# Patient Record
Sex: Female | Born: 1990 | Race: White | Hispanic: No | Marital: Single | Smoking: Never smoker
Health system: Southern US, Community
[De-identification: ages and names within clinical notes are randomized; demographics above are authoritative.]

---

## 2013-04-15 ENCOUNTER — Emergency Department: Payer: Self-pay | Admitting: Emergency Medicine

## 2013-04-15 LAB — URINALYSIS, COMPLETE
Glucose,UR: NEGATIVE mg/dL (ref 0–75)
Leukocyte Esterase: NEGATIVE
Nitrite: NEGATIVE
Protein: NEGATIVE
RBC,UR: 32 /HPF (ref 0–5)
Squamous Epithelial: 1
WBC UR: 7 /HPF (ref 0–5)

## 2013-04-22 ENCOUNTER — Emergency Department: Payer: Self-pay | Admitting: Emergency Medicine

## 2013-08-15 LAB — CBC WITH DIFFERENTIAL/PLATELET
BASOS ABS: 0 10*3/uL (ref 0.0–0.1)
Basophil %: 0.2 %
EOS PCT: 1 %
Eosinophil #: 0.1 10*3/uL (ref 0.0–0.7)
HCT: 38.6 % (ref 35.0–47.0)
HGB: 13.1 g/dL (ref 12.0–16.0)
LYMPHS ABS: 1.6 10*3/uL (ref 1.0–3.6)
Lymphocyte %: 14.1 %
MCH: 29.1 pg (ref 26.0–34.0)
MCHC: 34.1 g/dL (ref 32.0–36.0)
MCV: 86 fL (ref 80–100)
MONO ABS: 1.1 x10 3/mm — AB (ref 0.2–0.9)
MONOS PCT: 10.1 %
NEUTROS PCT: 74.6 %
Neutrophil #: 8.4 10*3/uL — ABNORMAL HIGH (ref 1.4–6.5)
Platelet: 228 10*3/uL (ref 150–440)
RBC: 4.52 10*6/uL (ref 3.80–5.20)
RDW: 14.2 % (ref 11.5–14.5)
WBC: 11.2 10*3/uL — AB (ref 3.6–11.0)

## 2013-08-15 LAB — URINALYSIS, COMPLETE
BILIRUBIN, UR: NEGATIVE
Blood: NEGATIVE
Glucose,UR: NEGATIVE mg/dL (ref 0–75)
Leukocyte Esterase: NEGATIVE
Nitrite: NEGATIVE
PROTEIN: NEGATIVE
Ph: 6 (ref 4.5–8.0)
RBC,UR: 4 /HPF (ref 0–5)
SPECIFIC GRAVITY: 1.004 (ref 1.003–1.030)
Squamous Epithelial: 1

## 2013-08-15 LAB — LIPASE, BLOOD: Lipase: 97 U/L (ref 73–393)

## 2013-08-15 LAB — COMPREHENSIVE METABOLIC PANEL
ALK PHOS: 62 U/L
AST: 11 U/L — AB (ref 15–37)
Albumin: 3.6 g/dL (ref 3.4–5.0)
Anion Gap: 10 (ref 7–16)
BUN: 8 mg/dL (ref 7–18)
Bilirubin,Total: 0.6 mg/dL (ref 0.2–1.0)
CALCIUM: 9.4 mg/dL (ref 8.5–10.1)
Chloride: 98 mmol/L (ref 98–107)
Co2: 26 mmol/L (ref 21–32)
Creatinine: 0.69 mg/dL (ref 0.60–1.30)
EGFR (African American): >60
EGFR (Non-African Amer.): >60
Glucose: 80 mg/dL (ref 65–99)
OSMOLALITY: 266 (ref 275–301)
POTASSIUM: 3.5 mmol/L (ref 3.5–5.1)
SGPT (ALT): 15 U/L (ref 12–78)
Sodium: 134 mmol/L — ABNORMAL LOW (ref 136–145)
TOTAL PROTEIN: 8.6 g/dL — AB (ref 6.4–8.2)

## 2013-08-15 LAB — PREGNANCY, URINE: Pregnancy Test, Urine: NEGATIVE m[IU]/mL

## 2013-08-16 ENCOUNTER — Observation Stay: Payer: Self-pay | Admitting: Surgery

## 2013-08-19 LAB — PATHOLOGY REPORT

## 2013-08-20 LAB — CULTURE, BLOOD (SINGLE)

## 2013-10-18 LAB — CBC
HCT: 41.3 % (ref 35.0–47.0)
HGB: 13.6 g/dL (ref 12.0–16.0)
MCH: 29.1 pg (ref 26.0–34.0)
MCHC: 33 g/dL (ref 32.0–36.0)
MCV: 88 fL (ref 80–100)
PLATELETS: 281 10*3/uL (ref 150–440)
RBC: 4.69 10*6/uL (ref 3.80–5.20)
RDW: 14.4 % (ref 11.5–14.5)
WBC: 10.8 10*3/uL (ref 3.6–11.0)

## 2013-10-18 LAB — COMPREHENSIVE METABOLIC PANEL
ALK PHOS: 58 U/L
ALT: 15 U/L (ref 12–78)
AST: 24 U/L (ref 15–37)
Albumin: 4.3 g/dL (ref 3.4–5.0)
Anion Gap: 8 (ref 7–16)
BUN: 7 mg/dL (ref 7–18)
Bilirubin,Total: 0.6 mg/dL (ref 0.2–1.0)
CALCIUM: 8.7 mg/dL (ref 8.5–10.1)
CHLORIDE: 105 mmol/L (ref 98–107)
CREATININE: 0.3 mg/dL — AB (ref 0.60–1.30)
Co2: 24 mmol/L (ref 21–32)
EGFR (African American): >60
EGFR (Non-African Amer.): >60
Glucose: 107 mg/dL — ABNORMAL HIGH (ref 65–99)
OSMOLALITY: 272 (ref 275–301)
Potassium: 3.5 mmol/L (ref 3.5–5.1)
Sodium: 137 mmol/L (ref 136–145)
Total Protein: 8 g/dL (ref 6.4–8.2)

## 2013-10-18 LAB — SALICYLATE LEVEL: Salicylates, Serum: <1.7 mg/dL

## 2013-10-18 LAB — ETHANOL
ETHANOL %: 0.256 % — AB (ref 0.000–0.080)
ETHANOL LVL: 256 mg/dL

## 2013-10-18 LAB — ACETAMINOPHEN LEVEL

## 2013-10-19 ENCOUNTER — Inpatient Hospital Stay: Payer: Self-pay | Admitting: Psychiatry

## 2013-10-20 LAB — PREGNANCY, URINE: PREGNANCY TEST, URINE: NEGATIVE m[IU]/mL

## 2013-10-20 LAB — DRUG SCREEN, URINE

## 2013-10-20 LAB — URINALYSIS, COMPLETE
BACTERIA: NONE SEEN
BLOOD: NEGATIVE
Bilirubin,UR: NEGATIVE
GLUCOSE, UR: NEGATIVE mg/dL (ref 0–75)
Leukocyte Esterase: NEGATIVE
Nitrite: NEGATIVE
PH: 5 (ref 4.5–8.0)
Protein: NEGATIVE
RBC,UR: 2 /HPF (ref 0–5)
SPECIFIC GRAVITY: 1.025 (ref 1.003–1.030)
Squamous Epithelial: 2
WBC UR: 3 /HPF (ref 0–5)

## 2014-10-11 NOTE — H&P (Signed)
History of Present Illness 24 yof with fevers for days, who began having abdominal pain yesterday, associated with nausea, anorexia, and vomiting.   Past Med/Surgical Hx:  Chronic Fatigue:   Fibromyalgia:   Tonsillectomy:   ALLERGIES:  No Known Allergies:   Family and Social History:  Family History Non-Contributory   Social History negative tobacco, voice major at [employer]   Review of Systems:  Fever/Chills Yes   Cough No   Sputum No   Abdominal Pain Yes   Diarrhea No   Constipation No   Nausea/Vomiting Yes   SOB/DOE No   Chest Pain No   Dysuria No   Tolerating PT Yes   Tolerating Diet No  Nauseated  Vomiting   Medications/Allergies Reviewed Medications/Allergies reviewed   Physical Exam:  GEN well developed, well nourished, no acute distress   HEENT pink conjunctivae, PERRL, hearing intact to voice, moist oral mucosa, good dentition   NECK supple  No masses   RESP normal resp effort  clear BS  no use of accessory muscles   CARD regular rate  no murmur  No LE edema  no JVD   ABD positive tenderness  RLQ, with rebound   LYMPH negative neck   EXTR negative cyanosis/clubbing, negative edema   SKIN normal to palpation, No rashes, No ulcers, skin turgor good   NEURO cranial nerves intact, negative tremor, follows commands, motor/sensory function intact   PSYCH alert, A+O to time, place, person   Lab Results: Hepatic:  26-Feb-15 20:19   Bilirubin, Total 0.6  Alkaline Phosphatase 62 (45-117 NOTE: New Reference Range 05/10/13)  SGPT (ALT) 15  SGOT (AST)  11  Total Protein, Serum  8.6  Albumin, Serum 3.6  Routine Chem:  26-Feb-15 20:19   Glucose, Serum 80  BUN 8  Creatinine (comp) 0.69  Sodium, Serum  134  Potassium, Serum 3.5  Chloride, Serum 98  CO2, Serum 26  Calcium (Total), Serum 9.4  Osmolality (calc) 266  eGFR (African American) >60  eGFR (Non-African American) >60 (eGFR values <63mL/min/1.73 m2 may be an  indication of chronic kidney disease (CKD). Calculated eGFR is useful in patients with stable renal function. The eGFR calculation will not be reliable in acutely ill patients when serum creatinine is changing rapidly. It is not useful in  patients on dialysis. The eGFR calculation may not be applicable to patients at the low and high extremes of body sizes, pregnant women, and vegetarians.)  Anion Gap 10  Lipase 97 (Result(s) reported on 15 Aug 2013 at 08:56PM.)  Routine UA:  26-Feb-15 20:19   Color (UA) Yellow  Clarity (UA) Clear  Glucose (UA) Negative  Bilirubin (UA) Negative  Ketones (UA) 1+  Specific Gravity (UA) 1.004  Blood (UA) Negative  pH (UA) 6.0  Protein (UA) Negative  Nitrite (UA) Negative  Leukocyte Esterase (UA) Negative (Result(s) reported on 15 Aug 2013 at 08:44PM.)  RBC (UA) 4 /HPF  WBC (UA) 4 /HPF  Bacteria (UA) 3+  Epithelial Cells (UA) 1 /HPF (Result(s) reported on 15 Aug 2013 at 08:44PM.)  Routine Sero:  26-Feb-15 20:19   Pregnancy Test, Urine NEGATIVE (The results of the qualitative urine HCG (Pregnancy Test) should be evaluated in light of other clinical information.  There are limitations to the test which, in certain clinical situations, may result in a false positive or negative result. Thehigh dose hook effect can occur in urine samples with extremely high HCG concentrations.  This effect can produce a  negative result in certain situations. It is suggested that results of the qualitative HCG be confirmed by an alternate methodology, such as the quantitative serum beta HCG test.)  Routine Hem:  26-Feb-15 20:19   WBC (CBC)  11.2  RBC (CBC) 4.52  Hemoglobin (CBC) 13.1  Hematocrit (CBC) 38.6  Platelet Count (CBC) 228  MCV 86  MCH 29.1  MCHC 34.1  RDW 14.2  Neutrophil % 74.6  Lymphocyte % 14.1  Monocyte % 10.1  Eosinophil % 1.0  Basophil % 0.2  Neutrophil #  8.4  Lymphocyte # 1.6  Monocyte #  1.1  Eosinophil # 0.1  Basophil # 0.0  (Result(s) reported on 15 Aug 2013 at 08:44PM.)   Radiology Results: LabUnknown:    27-Feb-15 01:06, CT Abdomen and Pelvis With Contrast  PACS Image  CT:  CT Abdomen and Pelvis With Contrast  REASON FOR EXAM:    (1) severe lower abd pain r>>l; (2) abd pain  COMMENTS:       PROCEDURE: CT  - CT ABDOMEN / PELVIS  W  - Aug 16 2013  1:06AM     CLINICAL DATA:  24 year old female with abdominal and pelvic pain,  fever and vomiting. Marland Kitchen    EXAM:  CT ABDOMEN AND PELVIS WITH CONTRAST    TECHNIQUE:  Multidetector CT imaging of the abdomen and pelvis was performed  using the standard protocol following bolus administration of  intravenous contrast.  CONTRAST:  100 cc intravenous Isovue-300    COMPARISON:  None.    FINDINGS:  The appendix is thickened with adjacent inflammation, compatible  with appendicitis. Small amount of free pelvic fluid is present.  There is no evidence of pneumoperitoneum, abscess or bowel  obstruction.    The liver, spleen, adrenal glands, kidneys, pancreas and gallbladder  are unremarkable.    No enlarged lymph nodes, biliary dilatation or abdominal aortic  aneurysm noted.  The bladder uterus and adnexal regions are unremarkable.    No acute or suspicious bony abnormalities are present.     IMPRESSION:  Appendicitis with small amount of nonspecific free pelvic fluid.    Results were called by telephone at the time of interpretation on  08/16/2013 at 1:15 AM to Dr. Beather Arbour , who verbally acknowledged these  results.      Electronically Signed    By: Hassan Rowan M.D.    On: 08/16/2013 01:16     Verified By: Lura Em, M.D.,    Assessment/Admission Diagnosis acute appendicitis   Plan lap appy pt understands plan and agrees   Electronic Signatures: Consuela Mimes (MD)  (Signed 27-Feb-15 01:40)  Authored: CHIEF COMPLAINT and HISTORY, PAST MEDICAL/SURGIAL HISTORY, ALLERGIES, FAMILY AND SOCIAL HISTORY, REVIEW OF SYSTEMS, PHYSICAL EXAM, LABS,  Radiology, ASSESSMENT AND PLAN   Last Updated: 27-Feb-15 01:40 by Consuela Mimes (MD)

## 2014-10-11 NOTE — H&P (Signed)
PATIENT NAME:  Brandi Black, Anvika MR#:  [id number] DATE OF BIRTH:  1991-03-13  DATE OF ADMISSION:  10/18/2013  DATE OF ASSESSMENT:  10/19/2013   IDENTIFYING INFORMATION AND CHIEF COMPLAINT: A 24 year old woman who came to the Emergency Room with an involuntary commitment then filed because of making suicidal statements while intoxicated.   CHIEF COMPLAINT: "I drank too much."   HISTORY OF PRESENT ILLNESS: Information obtained from the patient and the chart. The patient came to the Emergency Room with a blood alcohol level over 200. She says that yesterday she was drinking and had about 7 or more tequila shots. She does not remember what happened next, but apparently she started making statements about wanting to die or wanting to kill herself. She tells me now that she realizes she has been more depressed recently. Sleep is poor to intermittent. She feels physically sick and down. She has a history of chronic pain problems. She denies any psychotic symptoms. She is not currently on any medication or getting any kind of therapy or other mental health treatment.   PAST PSYCHIATRIC HISTORY: Has been treated for depression in the past, but never with medication. Has not seen a therapist in over a year, but has a diagnosis of depression in the past. The patient is very attached to her supposed diagnoses of chronic Lyme disease, fibromyalgia, and chronic fatigue syndrome and tends to see them as being bigger problems than the depression. Never been hospitalized before. Claims to have never tried to kill herself before.   SUBSTANCE ABUSE HISTORY: Drank heavily yesterday, but says that she does not routinely drink a lot. Denies other substance abuse.   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 Lives on campus with some roommates. She says that she was planning to go to [place] summer. [place].   FAMILY HISTORY: No family history known.   PAST MEDICAL  HISTORY: Apparently she has been given the diagnoses of chronic Lyme disease, fibromyalgia, and chronic fatigue syndrome. She is not on any treatment for them and has been told apparently that there is no treatment for any of it except the chronic Lyme disease, for which she has been treated on more than one occasion.   CURRENT MEDICATIONS: None.   ALLERGIES: GLUTEN.  REVIEW OF SYSTEMS: Depressed mood, tearfulness, sadness, some hopelessness. Denies current suicidal ideation. Denies psychotic symptoms. Chronic pain, which she describes as being everywhere all over her body, but mostly in her lower extremity joints.   MENTAL STATUS EXAMINATION: Reasonably well-groomed young woman, looks her stated age, cooperative with the interview. Eye contact good. Psychomotor activity subdued. Speech normal rate, tone and volume. Affect tearful, sad and dysphoric. Mood stated as depressed. Thoughts are lucid. No obvious delusions or loosening of associations. Denies auditory or visual hallucinations. Denies suicidal or homicidal intent currently. Judgment and insight: Recently some impairment. Fund of knowledge normal. Short and long-term memory intact, except for the period of time during which she had been intoxicated. Normal intelligence. Alert and oriented x 4.   PHYSICAL EXAMINATION: GENERAL: The patient does not appear to be in great physical distress. Normally built woman, looks her stated age.  SKIN: No skin lesions identified.  HEENT: Pupils equal and reactive. Face symmetric.  NEUROMUSCULAR: Full range of motion in all extremities. Normal gait. Strength and reflexes normal and symmetric throughout. Cranial nerves symmetric and normal.  LUNGS: Clear with no wheezes.  HEART: Regular rate and rhythm.  ABDOMEN: Soft, nontender,  normal bowel sounds.  CURRENT VITAL SIGNS: Include a blood pressure of 114/75, respirations 18, pulse 102, temperature 97.8.   LABORATORY RESULTS: Labs on admission include a  blood alcohol level late last night of 256. Creatinine slightly low at 0.3, otherwise unremarkable chemistries. CBC unremarkable. Acetaminophen and salicylates negative.   ASSESSMENT: A 24 year old woman who gives a history of multiple symptoms of depression,  probably has a major depressive disorder. She is quite attached to these diagnoses of "Lyme  disease" and "chronic fatigue syndrome" and fibromyalgia. She is not currently receiving any medication or receiving any therapy or treatment. Her family lives out of state. Given the recent behavior and suicidal statements, I do not feel comfortable immediately discharging her.   TREATMENT PLAN: Admit to psychiatry. Suicide precautions. She will be started on Cymbalta. The patient is agreeable to the plan. Engage in individual and group psychotherapy. Work on appropriate discharge planning.   DIAGNOSIS, PRINCIPAL AND PRIMARY:  AXIS I: Major depression, recurrent, moderate.   SECONDARY DIAGNOSES: AXIS I: No further diagnosis.  AXIS II: No diagnosis.  AXIS III: Fibromyalgia.  AXIS IV: Moderate from being away from home, stress of school, chronic illness.  AXIS V: Functioning at time of evaluation is 35.    ____________________________ Audery Amel, MD jtc:jcm D: 10/19/2013 15:46:02 ET T: 10/19/2013 16:54:43 ET JOB#: [id number]  cc: Audery Amel, MD, <Dictator> Audery Amel MD ELECTRONICALLY SIGNED 10/20/2013 0:19

## 2014-10-11 NOTE — Op Note (Signed)
PATIENT NAME:  Brandi Black, Brandi Black MR#:  [id number] DATE OF BIRTH:  1991-03-22  DATE OF PROCEDURE:  08/16/2013  PREOPERATIVE DIAGNOSIS: Acute appendicitis.   POSTOPERATIVE DIAGNOSIS: Acute appendicitis.   OPERATION PERFORMED: Laparoscopic appendectomy.   SURGEON: Consuela Mimes, M.D.   ANESTHESIA: General.   PROCEDURE IN DETAIL: The patient was placed supine on the operating room table and prepped and draped in the usual sterile fashion. A 1 inch incision was made in the supraumbilical midline and this was carried down through the linea alba and a Hassan cannula was introduced amidst horizontal mattress sutures of 0 Vicryl. A 15 mmHg CO2 pneumoperitoneum was created and 2 additional 5 mm trocars were placed under direct visualization. The appendix was swollen, injected, and had tiny amount of fibrinous exudate. There was no evidence of perforation. The mesoappendix was taken down with the Harmonic scalpel. The appendectomy was performed just at the base of the appendix where it met the cecum with the Endo GIA stapling device and was placed in an Endo Catch bag and extracted from the abdomen via the supraumbilical port. The right lower quadrant was irrigated with warm normal saline. This was suctioned clear, including from the pelvis, and the very diminutive omentum was dragged over top of the appendiceal stump. The peritoneum was desufflated and decannulated and the linea alba was closed with an interrupted 0 PDS suture along with the previously placed 0 Vicryls, which were tied one to another, and all 3 skin sites were closed with subcuticular 5-0 Monocryl and suture strips. The patient tolerated the procedure well. There were no complications.  ____________________________ Consuela Mimes, MD wfm:sb D: 08/16/2013 03:39:15 ET T: 08/16/2013 09:45:21 ET JOB#: [id number]  cc: Consuela Mimes, MD, <Dictator> Consuela Mimes MD ELECTRONICALLY SIGNED 08/18/2013 0:48

## 2014-10-11 NOTE — Discharge Summary (Signed)
PATIENT NAME:  Brandi Milford CageBRUCH, Eliora MR#:  [id number] DATE OF BIRTH:  [birth year]  DATE OF ADMISSION:  08/16/2013 DATE OF DISCHARGE:  08/17/2013  FINAL DIAGNOSIS: Acute suppurative appendicitis.   PRINCIPLE PROCEDURE: Laparoscopic appendectomy.  HOSPITAL COURSE SUMMARY: The patient had an uneventful stay. On postoperative day #1, the patient did have some nausea. She was kept over until the 1st of March, at which point she was tolerating a regular diet and was seen to be doing much better and was ambulating the halls with adequate pain control. She was therefore discharged in stable condition with followup in Dr. Hoy FinlayMarterre's office in 1 to 2 weeks.   DISCHARGE MEDICATIONS: Acetaminophen/hydrocodone 5/325, 1 tab every 6 hours as needed for pain.   Call with any questions or concerns.   ____________________________ Redge GainerMark A. Egbert GaribaldiBird, MD mab:jcm D: 08/26/2013 20:13:24 ET T: 08/26/2013 22:27:47 ET JOB#: [id number]  cc: Loraine LericheMark A. Egbert GaribaldiBird, MD, <Dictator> Windell Musson A Arly Salminen MD ELECTRONICALLY SIGNED 08/27/2013 0:44

## 2014-10-23 ENCOUNTER — Encounter: Payer: Self-pay | Admitting: Emergency Medicine

## 2014-10-23 ENCOUNTER — Emergency Department
Admission: EM | Admit: 2014-10-23 | Discharge: 2014-10-23 | Disposition: A | Discharge status: Home / Self Care | Payer: Managed Care, Other (non HMO) | Attending: Emergency Medicine | Admitting: Emergency Medicine

## 2014-10-23 DIAGNOSIS — X58XXXA Exposure to other specified factors, initial encounter: Secondary | ICD-10-CM | POA: Insufficient documentation

## 2014-10-23 DIAGNOSIS — Y998 Other external cause status: Secondary | ICD-10-CM | POA: Insufficient documentation

## 2014-10-23 DIAGNOSIS — Y9389 Activity, other specified: Secondary | ICD-10-CM | POA: Diagnosis not present

## 2014-10-23 DIAGNOSIS — S0591XA Unspecified injury of right eye and orbit, initial encounter: Secondary | ICD-10-CM | POA: Diagnosis present

## 2014-10-23 DIAGNOSIS — S0501XA Injury of conjunctiva and corneal abrasion without foreign body, right eye, initial encounter: Secondary | ICD-10-CM | POA: Diagnosis not present

## 2014-10-23 DIAGNOSIS — Y9289 Other specified places as the place of occurrence of the external cause: Secondary | ICD-10-CM | POA: Insufficient documentation

## 2014-10-23 MED ORDER — EYE WASH OPHTH SOLN
OPHTHALMIC | Status: AC
  Administered 2014-10-23: 2 [drp]
  Filled 2014-10-23: qty 118

## 2014-10-23 MED ORDER — ERYTHROMYCIN 5 MG/GM OP OINT: 1.0000 "application " | TOPICAL_OINTMENT | Freq: Four times a day (QID) | OPHTHALMIC | Status: AC

## 2014-10-23 MED ORDER — FLUORESCEIN SODIUM 1 MG OP STRP
ORAL_STRIP | OPHTHALMIC | Status: AC
  Administered 2014-10-23: 1
  Filled 2014-10-23: qty 1

## 2014-10-23 MED ORDER — ERYTHROMYCIN 5 MG/GM OP OINT
TOPICAL_OINTMENT | OPHTHALMIC | Status: AC
  Filled 2014-10-23: qty 1

## 2014-10-23 MED ORDER — TETRACAINE HCL 0.5 % OP SOLN
OPHTHALMIC | Status: AC
  Administered 2014-10-23: 14:00:00
  Filled 2014-10-23: qty 2

## 2014-10-23 MED ORDER — KETOROLAC TROMETHAMINE 0.5 % OP SOLN: 1.0000 [drp] | Freq: Four times a day (QID) | OPHTHALMIC | Status: AC

## 2014-10-23 MED ORDER — ERYTHROMYCIN 5 MG/GM OP OINT
1.0000 "application " | TOPICAL_OINTMENT | Freq: Four times a day (QID) | OPHTHALMIC | Status: DC
  Administered 2014-10-23: 1 via OPHTHALMIC

## 2014-10-23 NOTE — ED Notes (Signed)
C/o red, swollen right eye

## 2014-10-23 NOTE — Discharge Instructions (Signed)
Corneal Abrasion °The cornea is the clear covering at the front and center of the eye. When looking at the colored portion of the eye (iris), you are looking through the cornea. This very thin tissue is made up of many layers. The surface layer is a single layer of cells (corneal epithelium) and is one of the most sensitive tissues in the body. If a scratch or injury causes the corneal epithelium to come off, it is called a corneal abrasion. If the injury extends to the tissues below the epithelium, the condition is called a corneal ulcer. °CAUSES  °· Scratches. °· Trauma. °· Foreign body in the eye. °Some people have recurrences of abrasions in the area of the original injury even after it has healed (recurrent erosion syndrome). Recurrent erosion syndrome generally improves and goes away with time. °SYMPTOMS  °· Eye pain. °· Difficulty or inability to keep the injured eye open. °· The eye becomes very sensitive to light. °· Recurrent erosions tend to happen suddenly, first thing in the morning, usually after waking up and opening the eye. °DIAGNOSIS  °Your health care provider can diagnose a corneal abrasion during an eye exam. Dye is usually placed in the eye using a drop or a small paper strip moistened by your tears. When the eye is examined with a special light, the abrasion shows up clearly because of the dye. °TREATMENT  °· Small abrasions may be treated with antibiotic drops or ointment alone. °· A pressure patch may be put over the eye. If this is done, follow your doctor's instructions for when to remove the patch. Do not drive or use machines while the eye patch is on. Judging distances is hard to do with a patch on. °If the abrasion becomes infected and spreads to the deeper tissues of the cornea, a corneal ulcer can result. This is serious because it can cause corneal scarring. Corneal scars interfere with light passing through the cornea and cause a loss of vision in the involved eye. °HOME CARE  INSTRUCTIONS °· Use medicine or ointment as directed. Only take over-the-counter or prescription medicines for pain, discomfort, or fever as directed by your health care provider. °· Do not drive or operate machinery if your eye is patched. Your ability to judge distances is impaired. °· If your health care provider has given you a follow-up appointment, it is very important to keep that appointment. Not keeping the appointment could result in a severe eye infection or permanent loss of vision. If there is any problem keeping the appointment, let your health care provider know. °SEEK MEDICAL CARE IF:  °· You have pain, light sensitivity, and a scratchy feeling in one eye or both eyes. °· Your pressure patch keeps loosening up, and you can blink your eye under the patch after treatment. °· Any kind of discharge develops from the eye after treatment or if the lids stick together in the morning. °· You have the same symptoms in the morning as you did with the original abrasion days, weeks, or months after the abrasion healed. °MAKE SURE YOU:  °· Understand these instructions. °· Will watch your condition. °· Will get help right away if you are not doing well or get worse. °Document Released: 06/03/2000 Document Revised: 06/11/2013 Document Reviewed: 02/11/2013 °ExitCare® Patient Information ©2015 [employer]. This information is not intended to replace advice given to you by your health care provider. Make sure you discuss any questions you have with your health care provider. ° °

## 2014-10-23 NOTE — ED Provider Notes (Signed)
[facility] Emergency Department Provider Note ____________________________________________  Time seen: 1350  I have reviewed the triage vital signs and the nursing notes.   HISTORY  Chief Complaint Eye Problem  HPI Brandi Black is a 24 y.o. female who reports right greater than left, eye irritation times one day. She describes fallen asleep in her extended-wear contacts, overnight and awoke at 5 AM yesterday morning to remove them. Since that time she has noted irritation to the right eye primarily. She denies visual changes, crusting, matting, purulent discharge, or nausea and vomiting.  No past medical history on file.  There are no active problems to display for this patient.   No past surgical history on file.  Current Outpatient Rx  Name  Route  Sig  Dispense  Refill  . erythromycin ophthalmic ointment   Right Eye   Place 1 application into the right eye 4 (four) times daily.   3.5 g   0   . ketorolac (ACULAR) 0.5 % ophthalmic solution   Both Eyes   Place 1 drop into both eyes 4 (four) times daily.   5 mL   0     Allergies Review of patient's allergies indicates no known allergies.  No family history on file.  Social History History  Substance Use Topics  . Smoking status: Never Smoker   . Smokeless tobacco: Not on file  . Alcohol Use: Not on file    Review of Systems  Constitutional: Negative for fever. Eyes: Negative for visual changes. ENT: Negative for sore throat. Cardiovascular: Negative for chest pain. Respiratory: Negative for shortness of breath. Gastrointestinal: Negative for abdominal pain, vomiting and diarrhea. Genitourinary: Negative for dysuria. Musculoskeletal: Negative for back pain. Skin: Negative for rash. Neurological: Negative for headaches, focal weakness or numbness.  10-point ROS otherwise negative.  ____________________________________________   PHYSICAL EXAM:  VITAL SIGNS: ED  Triage Vitals  Enc Vitals Group     BP 10/23/14 1309 120/71 mmHg     Pulse Rate 10/23/14 1309 84     Resp 10/23/14 1309 18     Temp 10/23/14 1309 98 F (36.7 C)     Temp Source 10/23/14 1309 Oral     SpO2 10/23/14 1309 100 %     Weight 10/23/14 1309 137 lb (62.143 kg)     Height 10/23/14 1309 5\' 3"  (1.6 m)     Head Cir --      Peak Flow --      Pain Score 10/23/14 1310 5     Pain Loc --      Pain Edu? --      Excl. in GC? --     Constitutional: Alert and oriented. Well appearing and in no distress. Eyes: Conjunctivae are mildly injected on the right eye. PERRL. Normal extraocular movements. + fluorescein dye uptake to the right eye.  Moderate, 4mm diameter corneal abrasion, overlying the pupil. Localized to between 5 o'clock and 8 o'clock, with irregular borders.  ENT   Head: Normocephalic and atraumatic.   Nose: No congestion/rhinnorhea.   Mouth/Throat: Mucous membranes are moist.   Neck: No stridor. Hematological/Lymphatic/Immunilogical: No cervical lymphadenopathy. Cardiovascular: Normal rate, regular rhythm. Normal and symmetric distal pulses are present in all extremities. No murmurs, rubs, or gallops. Respiratory: Normal respiratory effort without tachypnea nor retractions. Breath sounds are clear and equal bilaterally. No wheezes/rales/rhonchi. Musculoskeletal: Nontender with normal range of motion in all extremities. No joint effusions.  No lower extremity tenderness nor edema. Neurologic:  Normal speech  and language. No gross focal neurologic deficits are appreciated. Speech is normal. No gait instability. Skin:  Skin is warm, dry and intact. No rash noted. Psychiatric: Mood and affect are normal. Speech and behavior are normal. Patient exhibits appropriate insight and judgment. ____________________________________________   PROCEDURES  Procedure(s) performed: None  Critical Care performed: No  ____________________________________________   INITIAL  IMPRESSION / ASSESSMENT AND PLAN / ED COURSE  Corneal abrasion care instructions given.  Initial dose of antibiotic ointment instilled into right eye prior to discharge.   Pertinent labs & imaging results that were available during my care of the patient were reviewed by me and considered in my medical decision making (see chart for details).  ____________________________________________   FINAL CLINICAL IMPRESSION(S) / ED DIAGNOSES  Final diagnoses:  Cornea abrasion, right, initial encounter     Lissa HoardJenise V Bacon Sindia Kowalczyk, PA-C 10/23/14 1737  Darien Ramusavid W Kaminski, MD 10/23/14 2234

## 2014-10-23 NOTE — ED Notes (Signed)
NAD noted at time of D/C. Pt denies questions or concerns. Pt ambulatory to the lobby at this time.  

## 2015-06-11 IMAGING — CT CT ABD-PELV W/ CM
2 of 4 series · 16 of 46 positions shown, 18 images · IV contrast (agent unspecified)
Comparison: None.

CLINICAL DATA: 22-year-old female with abdominal and pelvic pain,
fever and vomiting. .

EXAM:
CT ABDOMEN AND PELVIS WITH CONTRAST
TECHNIQUE: Multidetector CT imaging of the abdomen and pelvis was performed
using the standard protocol following bolus administration of
intravenous contrast.
CONTRAST:  100 cc intravenous [id number]

[Series 2: routine abd pel with · axial · 0.60mm/px · z∈[-1102,-712]mm · 13 of 86 slices shown, 15 images]
[im 4/86  soft-tissue]
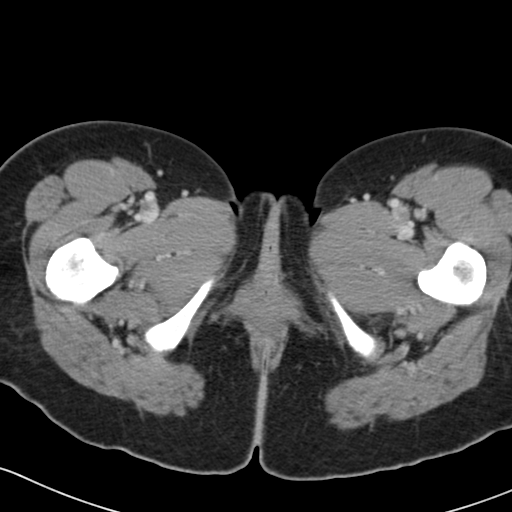
[im 4/86  bone]
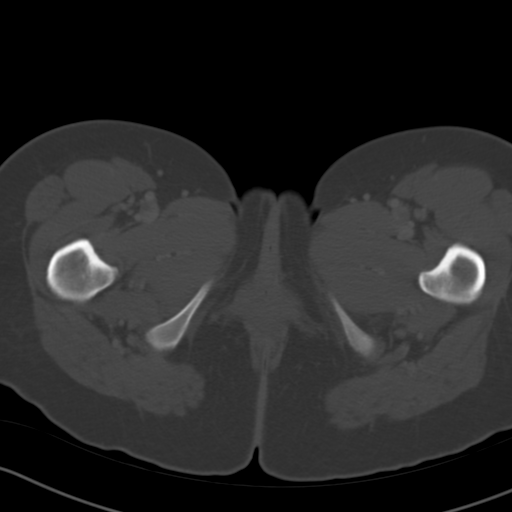
[im 12/86  soft-tissue]
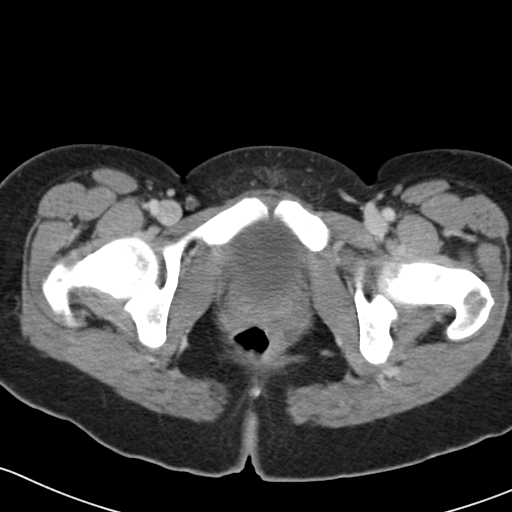
[im 19/86  soft-tissue]
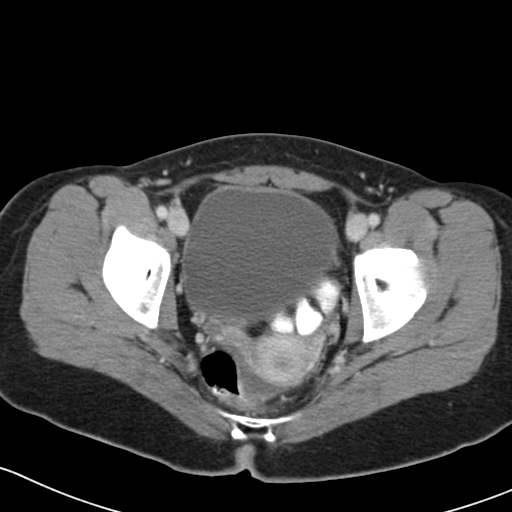
[im 23/86  soft-tissue]
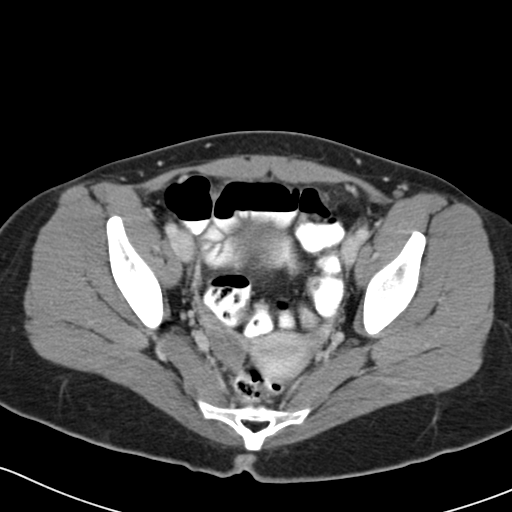
[im 30/86  soft-tissue]
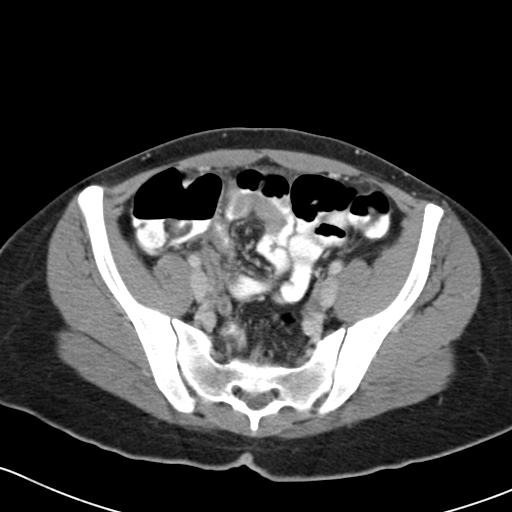
[im 37/86  soft-tissue]
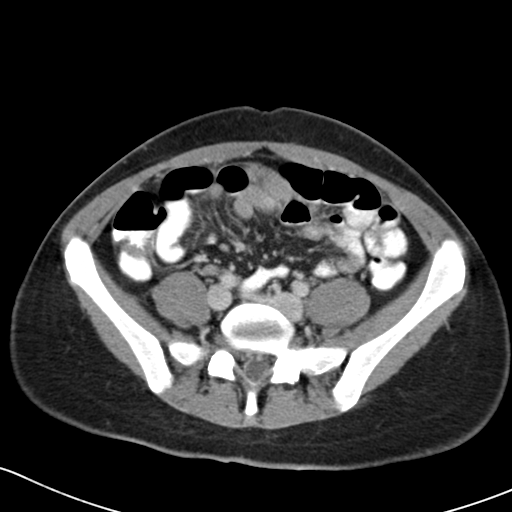
[im 45/86  soft-tissue]
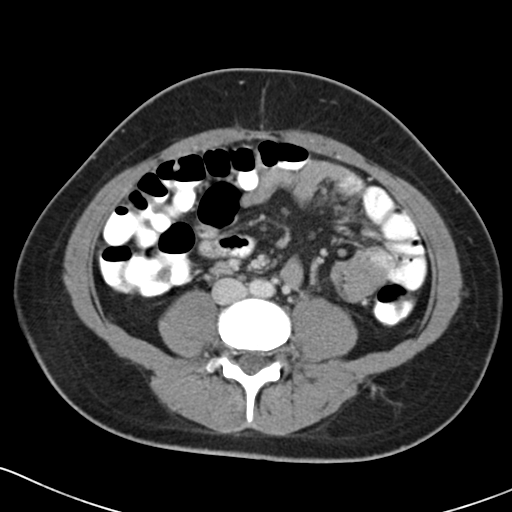
[im 49/86  soft-tissue]
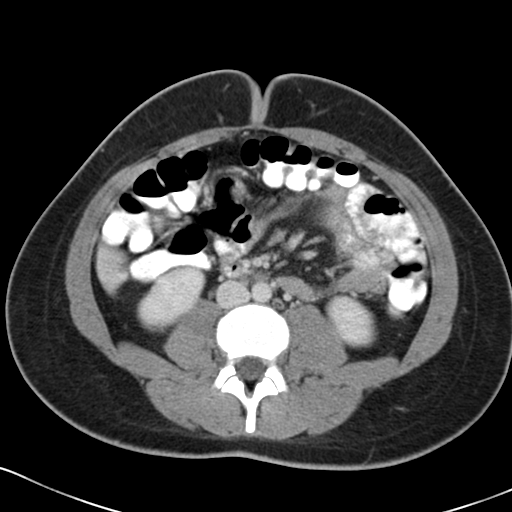
[im 56/86  soft-tissue]
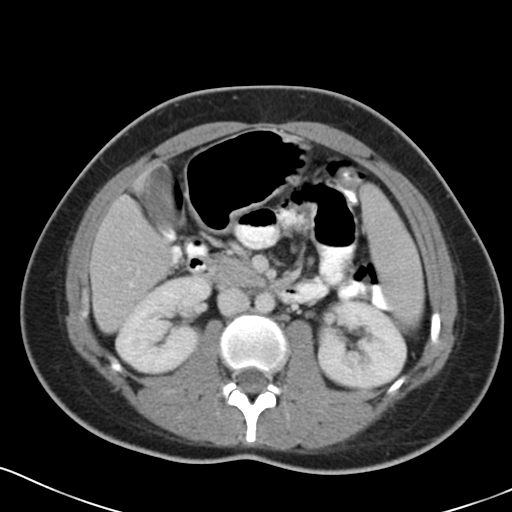
[im 56/86  bone]
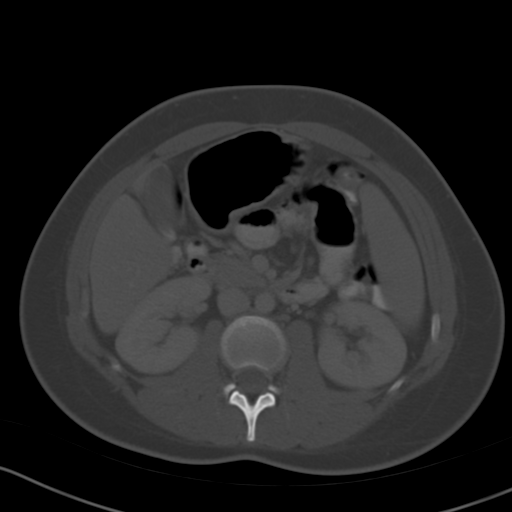
[im 63/86  soft-tissue]
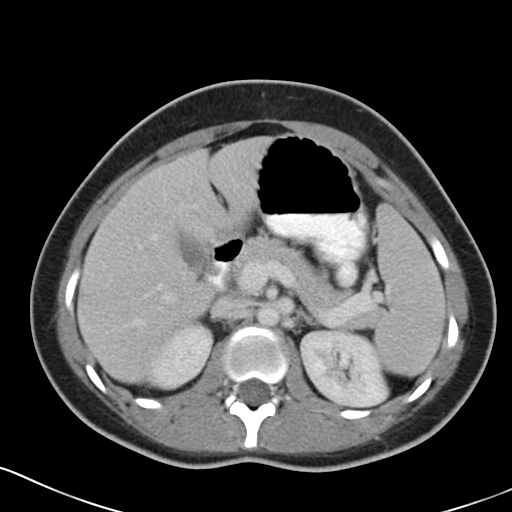
[im 67/86  soft-tissue]
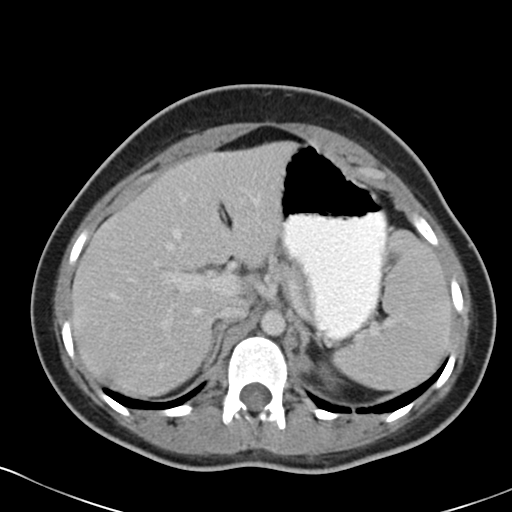
[im 74/86  soft-tissue]
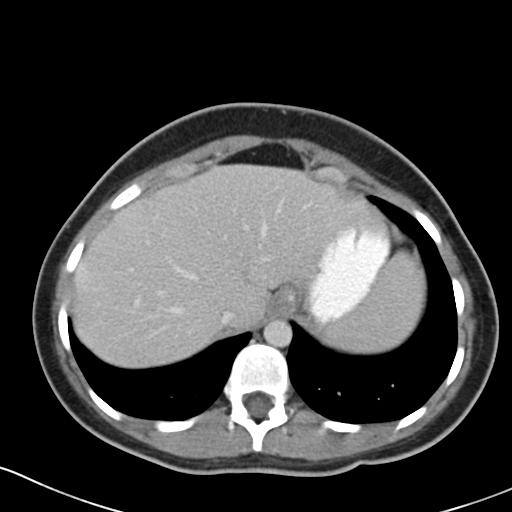
[im 82/86  soft-tissue]
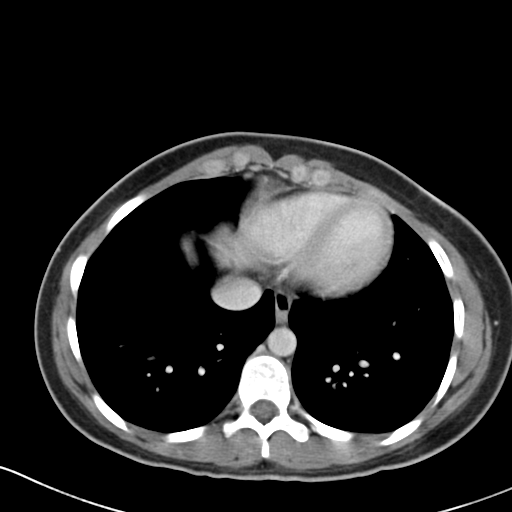

[Series 5: cor routine abd pel with · coronal · 0.63mm/px · 3 of 126 slices shown]
[im 42/126  soft-tissue]
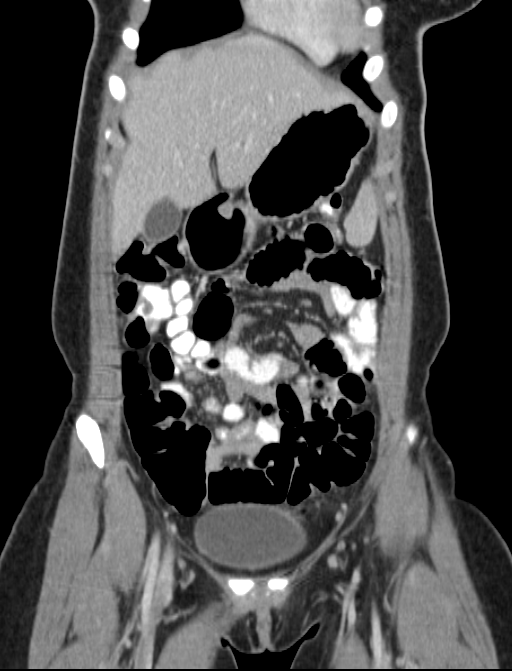
[im 56/126  soft-tissue]
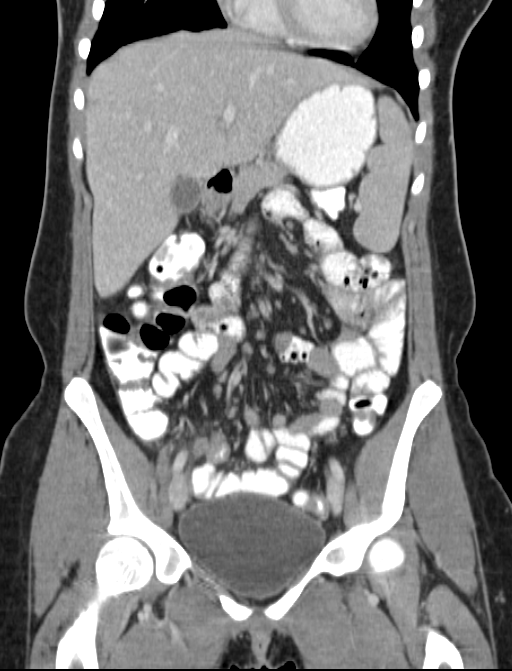
[im 70/126  soft-tissue]
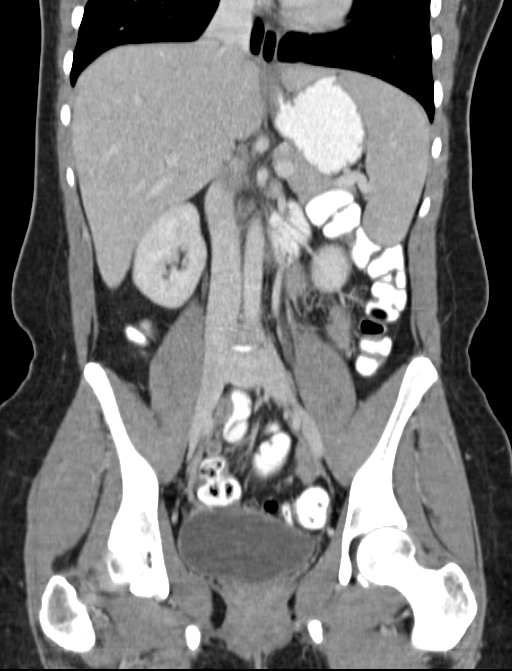

[16 of 46 positions shown; findings below may reference images not displayed]

FINDINGS: The appendix is thickened with adjacent inflammation, compatible
with appendicitis. Small amount of free pelvic fluid is present.
There is no evidence of pneumoperitoneum, abscess or bowel
obstruction.

The liver, spleen, adrenal glands, kidneys, pancreas and gallbladder
are unremarkable.

No enlarged lymph nodes, biliary dilatation or abdominal aortic
aneurysm noted.

The bladder uterus and adnexal regions are unremarkable.

No acute or suspicious bony abnormalities are present.
IMPRESSION: Appendicitis with small amount of nonspecific free pelvic fluid.

Results were called by telephone at the time of interpretation on
08/16/2013 at [DATE] to Dr. Mester , who verbally acknowledged these
results.
